# Patient Record
Sex: Male | Born: 1975 | Race: White | Hispanic: No | Marital: Married | Smoking: Never smoker
Health system: Southern US, Community
[De-identification: ages and names within clinical notes are randomized; demographics above are authoritative.]

## PROBLEM LIST (undated history)

## (undated) DIAGNOSIS — G43909 Migraine, unspecified, not intractable, without status migrainosus: Secondary | ICD-10-CM

## (undated) HISTORY — PX: WISDOM TOOTH EXTRACTION: SHX21

## (undated) HISTORY — PX: HAND SURGERY: SHX662

---

## 2011-12-26 ENCOUNTER — Ambulatory Visit: Payer: Self-pay | Admitting: Family Medicine

## 2014-02-20 ENCOUNTER — Ambulatory Visit: Payer: Self-pay | Admitting: Physician Assistant

## 2017-10-20 ENCOUNTER — Ambulatory Visit: Payer: BLUE CROSS/BLUE SHIELD

## 2017-10-20 ENCOUNTER — Other Ambulatory Visit: Payer: Self-pay

## 2017-10-20 ENCOUNTER — Ambulatory Visit (INDEPENDENT_AMBULATORY_CARE_PROVIDER_SITE_OTHER): Payer: BLUE CROSS/BLUE SHIELD

## 2017-10-20 ENCOUNTER — Encounter: Payer: Self-pay | Admitting: Emergency Medicine

## 2017-10-20 ENCOUNTER — Ambulatory Visit
Admission: EM | Admit: 2017-10-20 | Discharge: 2017-10-20 | Disposition: A | Discharge status: Home / Self Care | Payer: BLUE CROSS/BLUE SHIELD | Attending: Family Medicine | Admitting: Family Medicine

## 2017-10-20 DIAGNOSIS — S2231XA Fracture of one rib, right side, initial encounter for closed fracture: Secondary | ICD-10-CM

## 2017-10-20 DIAGNOSIS — W19XXXA Unspecified fall, initial encounter: Secondary | ICD-10-CM

## 2017-10-20 DIAGNOSIS — R0789 Other chest pain: Secondary | ICD-10-CM | POA: Diagnosis not present

## 2017-10-20 HISTORY — DX: Migraine, unspecified, not intractable, without status migrainosus: G43.909

## 2017-10-20 MED ORDER — HYDROCODONE-ACETAMINOPHEN 5-325 MG PO TABS: ORAL_TABLET | ORAL | 0 refills | Status: DC

## 2017-10-20 MED ORDER — CLONIDINE HCL 0.1 MG PO TABS: 0.1000 mg | ORAL_TABLET | Freq: Every day | ORAL | Status: DC

## 2017-10-20 NOTE — ED Triage Notes (Signed)
Patient in today after falling on 10/11/17. Patient states he was sleep walking and fell down the steps outside and landed on concrete. Patient having difficulty taking a deep breath.

## 2017-10-20 NOTE — Discharge Instructions (Signed)
Rest, ice , ibuprofen.

## 2017-10-20 NOTE — ED Provider Notes (Signed)
[facility]    CSN: [id number] Arrival date & time: 10/20/17  0803     History   Chief Complaint Chief Complaint  Patient presents with  . Fall    DOA 10/11/17    HPI Jonathan Cooper is a 42 y.o. male.   42 yo male with a c/o right upper chest pain since falling about 1 week ago while sleepwalking. States pain is worse with deep breaths or position changes.   The history is provided by the patient.  Fall     Past Medical History:  Diagnosis Date  . Migraine     There are no active problems to display for this patient.   Past Surgical History:  Procedure Laterality Date  . HAND SURGERY Right   . WISDOM TOOTH EXTRACTION         Home Medications    Prior to Admission medications   Medication Sig Start Date End Date Taking? Authorizing Provider  HYDROcodone-acetaminophen (NORCO/VICODIN) 5-325 MG tablet 1-2 tabs po qd prn 10/20/17   Payton Mccallumonty, Rosilyn Coachman, MD    Family History Family History  Problem Relation Age of Onset  . Diabetes Mother   . Hypertension Father     Social History Social History   Tobacco Use  . Smoking status: Never Smoker  . Smokeless tobacco: Former Engineer, waterUser  Substance Use Topics  . Alcohol use: Yes    Alcohol/week: 1.8 oz    Types: 3 Cans of beer per week  . Drug use: Never     Allergies   Patient has no known allergies.   Review of Systems Review of Systems   Physical Exam Triage Vital Signs ED Triage Vitals [10/20/17 0817]  Enc Vitals Group     BP (!) 144/97     Pulse Rate 79     Resp 16     Temp 97.9 F (36.6 C)     Temp Source Oral     SpO2 100 %     Weight 230 lb (104.3 kg)     Height 6' (1.829 m)     Head Circumference      Peak Flow      Pain Score 2     Pain Loc      Pain Edu?      Excl. in [place]?    No data found.  Updated Vital Signs BP (!) 144/97 (BP Location: Left Arm)   Pulse 79   Temp 97.9 F (36.6 C) (Oral)   Resp 16   Ht 6' (1.829 m)   Wt 230 lb (104.3 kg)   SpO2 100%    BMI 31.19 kg/m   Visual Acuity Right Eye Distance:   Left Eye Distance:   Bilateral Distance:    Right Eye Near:   Left Eye Near:    Bilateral Near:     Physical Exam  Constitutional: He appears well-developed and well-nourished. No distress.  Cardiovascular: Normal rate, regular rhythm, normal heart sounds and intact distal pulses.  Pulmonary/Chest: Effort normal and breath sounds normal. No stridor. No respiratory distress. He has no wheezes. He has no rales. He exhibits tenderness (right mid upper).  Skin: He is not diaphoretic.  Nursing note and vitals reviewed.    UC Treatments / Results  Labs (all labs ordered are listed, but only abnormal results are displayed) Labs Reviewed - No data to display  EKG None  Radiology Dg Ribs Unilateral W/chest Right  Result Date: 10/20/2017 CLINICAL DATA:  fall; pain EXAM: RIGHT  RIBS AND CHEST - 3+ VIEW COMPARISON:  None. FINDINGS: The heart size appears normal. There is no pleural effusion or pneumothorax. No airspace opacities. There is an acute right anterior seventh rib fracture. IMPRESSION: 1. Right anterior seventh rib fracture deformity. Electronically Signed   By: Signa Kell M.D.   On: 10/20/2017 09:53    Procedures Procedures (including critical care time)  Medications Ordered in UC Medications  cloNIDine (CATAPRES) tablet 0.1 mg (has no administration in time range)    Initial Impression / Assessment and Plan / UC Course  I have reviewed the triage vital signs and the nursing notes.  Pertinent labs & imaging results that were available during my care of the patient were reviewed by me and considered in my medical decision making (see chart for details).     Final Clinical Impressions(s) / UC Diagnoses   Final diagnoses:  Closed fracture of one rib of right side, initial encounter     Discharge Instructions     Rest, ice, ibuprofen     ED Prescriptions    Medication Sig Dispense Auth. Provider    HYDROcodone-acetaminophen (NORCO/VICODIN) 5-325 MG tablet 1-2 tabs po qd prn 10 tablet Toron Bowring, Pamala Hurry, MD     1. x-ray results and diagnosis reviewed with patient 2. rx as per orders above; reviewed possible side effects, interactions, risks and benefits  3. Recommend supportive treatment as above 4. Follow-up prn if symptoms worsen or don't improve   Controlled Substance Prescriptions [place] Controlled Substance Registry consulted? Not Applicable   Payton Mccallum, MD 10/20/17 1024

## 2019-01-05 ENCOUNTER — Ambulatory Visit (INDEPENDENT_AMBULATORY_CARE_PROVIDER_SITE_OTHER): Payer: BC Managed Care – PPO

## 2019-01-05 ENCOUNTER — Ambulatory Visit
Admission: EM | Admit: 2019-01-05 | Discharge: 2019-01-05 | Disposition: A | Discharge status: Home / Self Care | Payer: BC Managed Care – PPO | Attending: Family Medicine | Admitting: Family Medicine

## 2019-01-05 ENCOUNTER — Other Ambulatory Visit: Payer: Self-pay

## 2019-01-05 DIAGNOSIS — M79642 Pain in left hand: Secondary | ICD-10-CM

## 2019-01-05 DIAGNOSIS — S6992XA Unspecified injury of left wrist, hand and finger(s), initial encounter: Secondary | ICD-10-CM

## 2019-01-05 DIAGNOSIS — S6010XA Contusion of unspecified finger with damage to nail, initial encounter: Secondary | ICD-10-CM | POA: Diagnosis not present

## 2019-01-05 DIAGNOSIS — W231XXA Caught, crushed, jammed, or pinched between stationary objects, initial encounter: Secondary | ICD-10-CM | POA: Diagnosis not present

## 2019-01-05 MED ORDER — MELOXICAM 15 MG PO TABS: 15.0000 mg | ORAL_TABLET | Freq: Every day | ORAL | 0 refills | Status: AC | PRN

## 2019-01-05 NOTE — Discharge Instructions (Signed)
Medication as needed.  No fracture.  Take care  Dr. Lacinda Axon

## 2019-01-05 NOTE — ED Triage Notes (Signed)
Patient states that he smashed his left hand while carrying a TV last night. Patient states that pain radiates from fingers up his left arm. Patient has blood under fingernail of left ring finger.

## 2019-01-05 NOTE — ED Provider Notes (Signed)
[facility]    CSN: [id number] Arrival date & time: 01/05/19  0820  History   Chief Complaint Chief Complaint  Patient presents with   Hand Pain    left hand   HPI  43 year old male presents with left hand pain.  Patient was moving a TV last night.  He states that he was holding the TV and the base of the TV when the base came off and he subsequently smashed his left hand.  Patient is reporting diffuse hand pain but is most notable at the left fourth digit.  He has a subungual hematoma of this digit as well.  Mild swelling of this digit.  Pain is currently 7/10 in severity.  Described as throbbing.  No relieving factors.  No other associated symptoms.  No other complaints.  PMH, Surgical Hx, Family Hx, Social History reviewed and updated as below.  Past Medical History:  Diagnosis Date   Migraine   Retinal vein occlusion  Past Surgical History:  Procedure Laterality Date   HAND SURGERY Right    WISDOM TOOTH EXTRACTION      Home Medications    Prior to Admission medications   Medication Sig Start Date End Date Taking? Authorizing Provider  meloxicam (MOBIC) 15 MG tablet Take 1 tablet (15 mg total) by mouth daily as needed. 01/05/19   Coral Spikes, DO    Family History Family History  Problem Relation Age of Onset   Diabetes Mother    Hypertension Father     Social History Social History   Tobacco Use   Smoking status: Never Smoker   Smokeless tobacco: Former Systems developer  Substance Use Topics   Alcohol use: Yes    Alcohol/week: 3.0 standard drinks    Types: 3 Cans of beer per week    Comment: occasionally   Drug use: Never     Allergies   Patient has no known allergies.   Review of Systems Review of Systems  Constitutional: Negative.   Musculoskeletal:       Left hand pain.  Skin:       Subungual hematoma.   Physical Exam Triage Vital Signs ED Triage Vitals  Enc Vitals Group     BP 01/05/19 0835 (!) 135/97     Pulse Rate  01/05/19 0835 62     Resp 01/05/19 0835 16     Temp 01/05/19 0835 98.6 F (37 C)     Temp Source 01/05/19 0835 Oral     SpO2 01/05/19 0835 100 %     Weight 01/05/19 0832 225 lb (102.1 kg)     Height 01/05/19 0832 6' (1.829 m)     Head Circumference --      Peak Flow --      Pain Score 01/05/19 0832 7     Pain Loc --      Pain Edu? --      Excl. in [place]? --    Updated Vital Signs BP (!) 135/97 (BP Location: Right Arm)    Pulse 62    Temp 98.6 F (37 C) (Oral)    Resp 16    Ht 6' (1.829 m)    Wt 102.1 kg    SpO2 100%    BMI 30.52 kg/m   Visual Acuity Right Eye Distance:   Left Eye Distance:   Bilateral Distance:    Right Eye Near:   Left Eye Near:    Bilateral Near:     Physical Exam Vitals signs and nursing  note reviewed.  Constitutional:      General: He is not in acute distress.    Appearance: Normal appearance. He is not ill-appearing.  HENT:     Head: Normocephalic and atraumatic.  Eyes:     General:        Right eye: No discharge.        Left eye: No discharge.     Conjunctiva/sclera: Conjunctivae normal.  Pulmonary:     Effort: Pulmonary effort is normal. No respiratory distress.  Musculoskeletal:     Comments: Left wrist -no discrete areas of tenderness.    Left hand -patient with tenderness over the DIP joint and distally (left 4th digit).  No other discrete areas of tenderness.  Skin:    Comments: Left 4th digit -moderate subungual hematoma.  Neurological:     Mental Status: He is alert.  Psychiatric:        Mood and Affect: Mood normal.        Behavior: Behavior normal.    UC Treatments / Results  Labs (all labs ordered are listed, but only abnormal results are displayed) Labs Reviewed - No data to display  EKG   Radiology Dg Hand Complete Left  Result Date: 01/05/2019 CLINICAL DATA:  Pt injured left hand carrying TV and 4th finger got smashed when he struggled not to drop it carrying down stairs. Most pain pain with blood under nail bed of  4th finger distal phalanx that has been drained. Small laceration left.*comment was truncated*pain, smashed hand by a TV EXAM: LEFT HAND - COMPLETE 3+ VIEW COMPARISON:  None. FINDINGS: No evidence of fracture of the carpal or metacarpal bones. Radiocarpal joint is intact. Phalanges are normal. No soft tissue injury. IMPRESSION: No fracture or dislocation. Electronically Signed   By: Genevive BiStewart  Edmunds M.D.   On: 01/05/2019 09:16    Procedures Procedures (including critical care time) Drainage of subungual hematoma Area was cleaned with alcohol.  Cautery was used to puncture the nail and drain hematoma.  Patient tolerated procedure without difficulty.  Medications Ordered in UC Medications - No data to display  Initial Impression / Assessment and Plan / UC Course  I have reviewed the triage vital signs and the nursing notes.  Pertinent labs & imaging results that were available during my care of the patient were reviewed by me and considered in my medical decision making (see chart for details).    43 year old male presents with a hand injury.  X-ray negative.  Subungual hematoma was drained today.  See above.  Meloxicam as needed for pain.  Requested a finger splint and was splinted by nursing staff.  Supportive care.  Final Clinical Impressions(s) / UC Diagnoses   Final diagnoses:  Subungual hematoma of digit of hand, initial encounter  Injury of left hand, initial encounter     Discharge Instructions     Medication as needed.  No fracture.  Take care  Dr. Adriana Simasook     ED Prescriptions    Medication Sig Dispense Auth. Provider   meloxicam (MOBIC) 15 MG tablet Take 1 tablet (15 mg total) by mouth daily as needed. 30 tablet Tommie Samsook, Latondra Gebhart G, DO     Controlled Substance Prescriptions [place] Controlled Substance Registry consulted? Not Applicable   Tommie SamsCook, Tyresha Fede G, [place] 01/05/19 [id number]

## 2019-02-24 IMAGING — CR DG RIBS W/ CHEST 3+V*R*
5 series · 5 of 5 positions shown · non-contrast
Comparison: None.

CLINICAL DATA: fall; pain

EXAM:
RIGHT RIBS AND CHEST - 3+ VIEW

[chest pa]
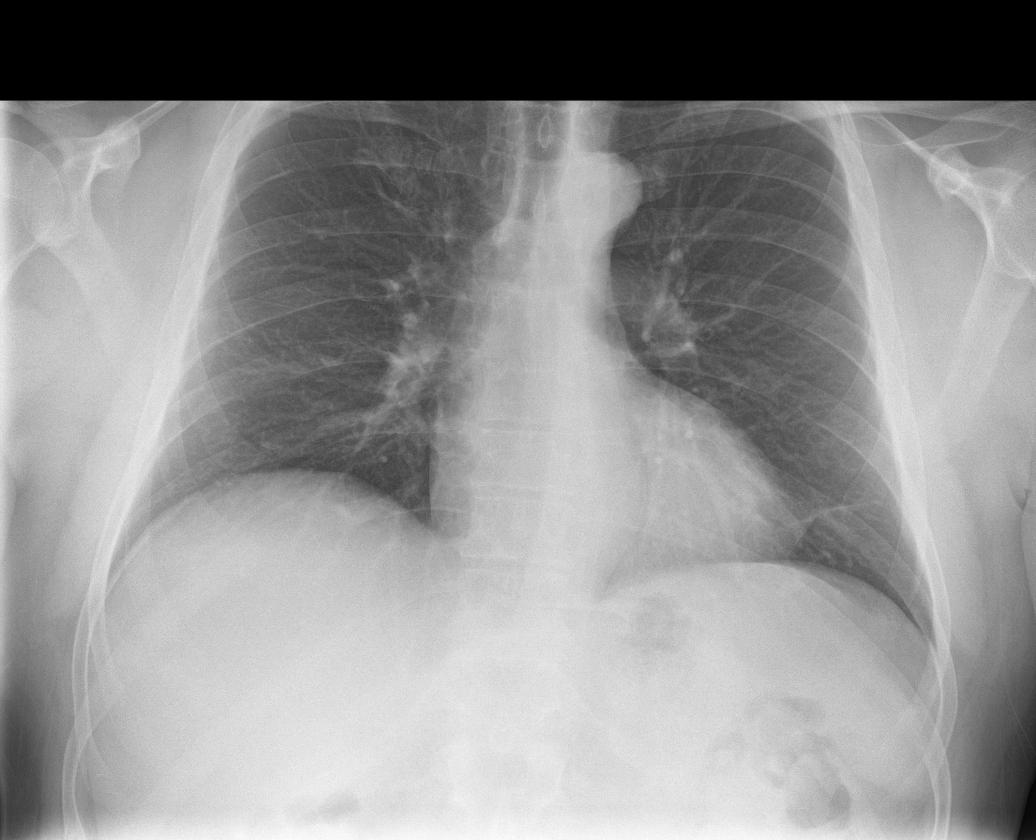

[rib pa (1 of 2)]
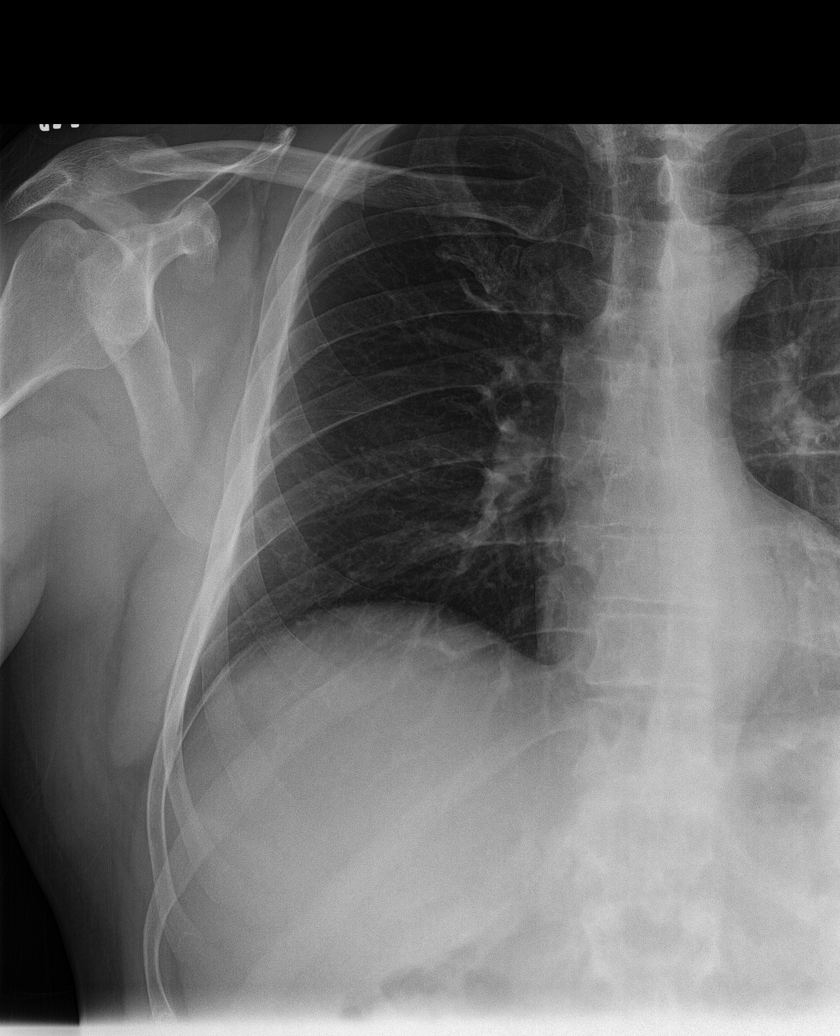

[rib pa (2 of 2)]
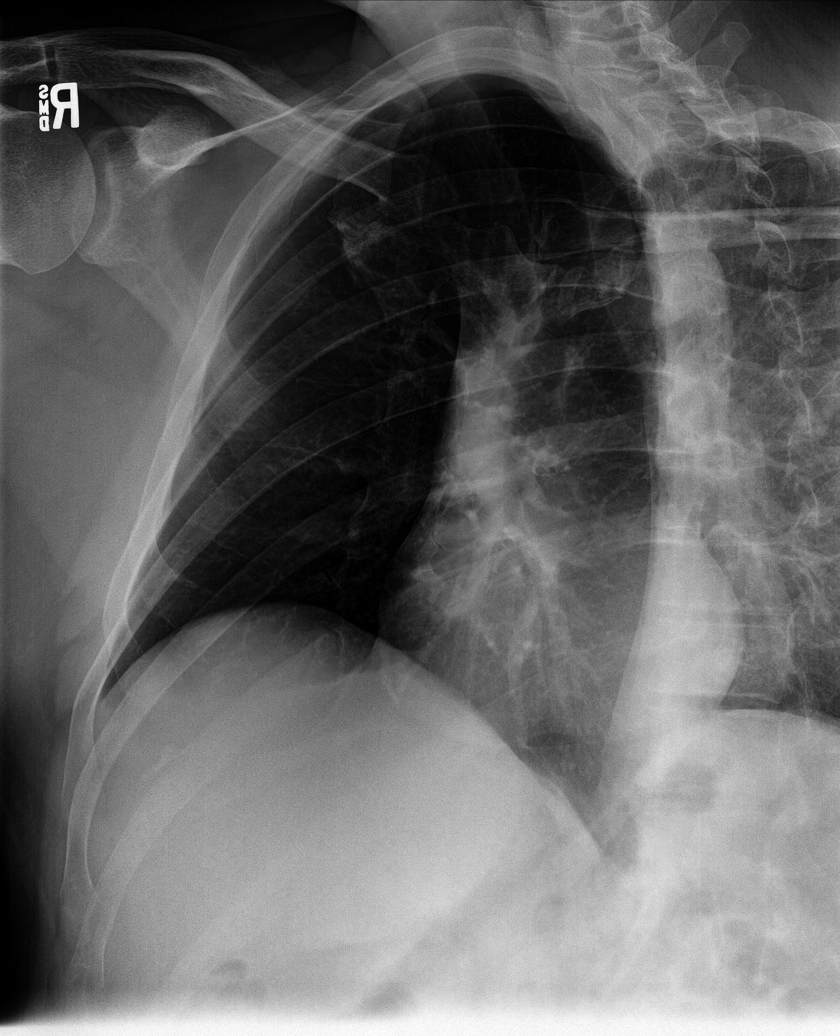

[rib obl (1 of 2)]
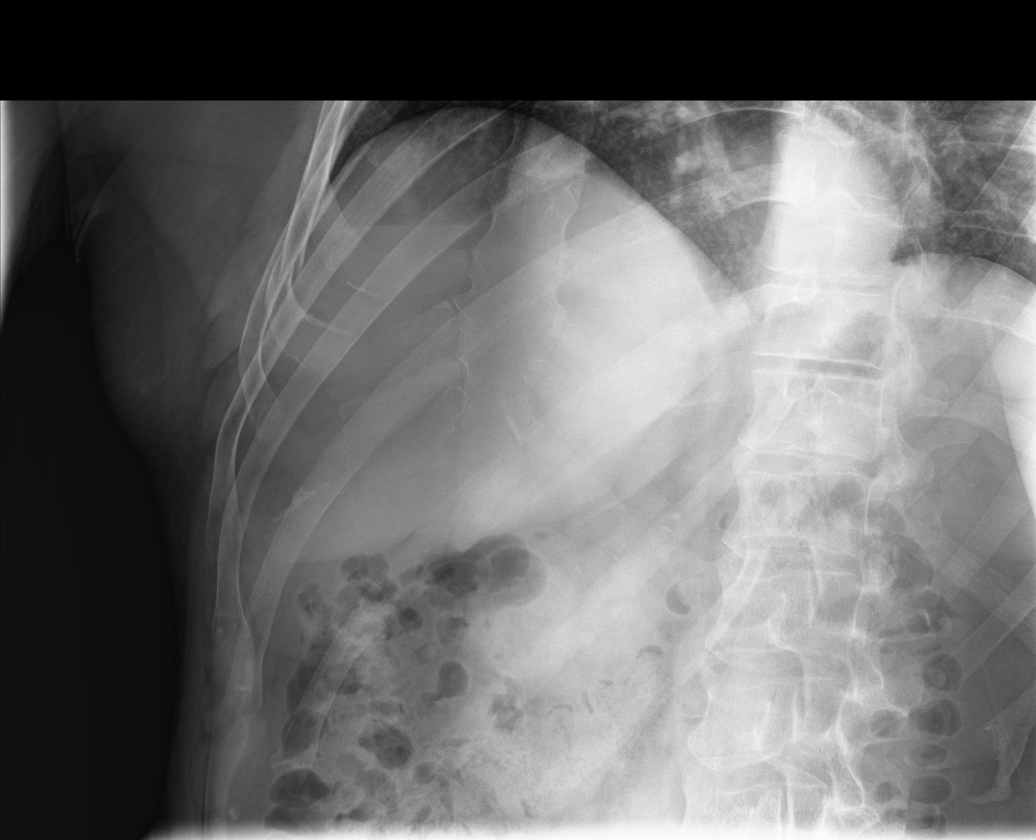

[rib obl (2 of 2)]
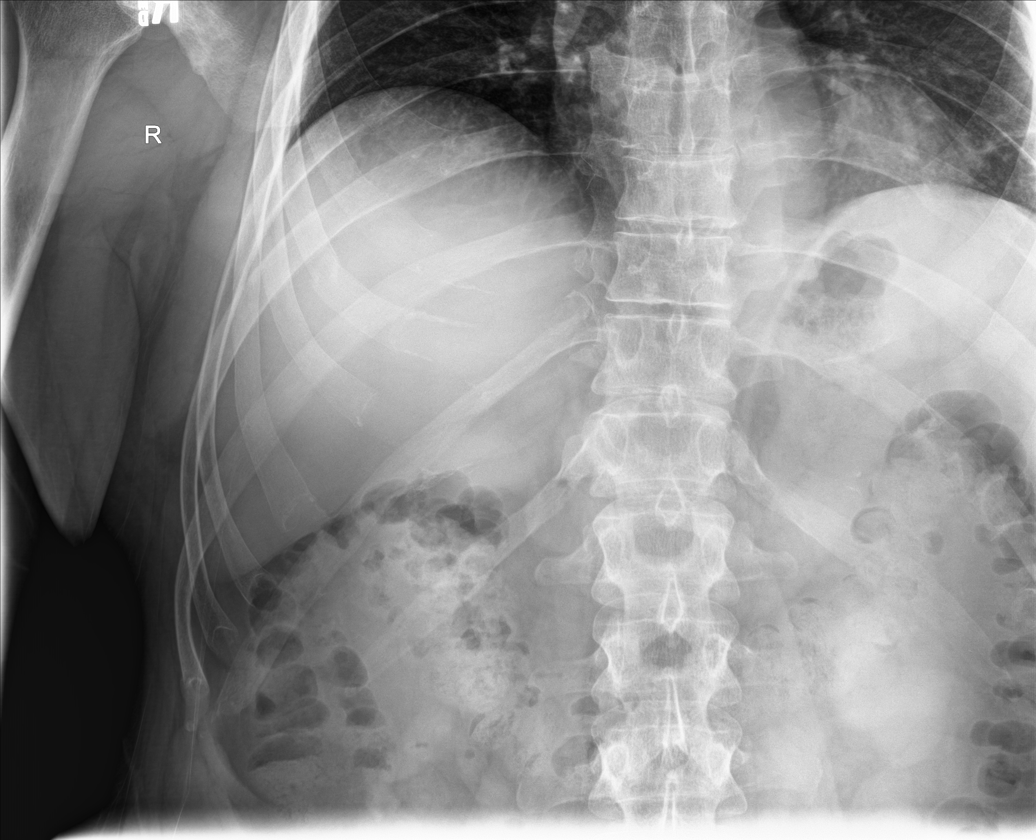

[5 of 5 positions shown; findings below may reference images not displayed]

FINDINGS: The heart size appears normal. There is no pleural effusion or
pneumothorax. No airspace opacities. There is an acute right
anterior seventh rib fracture.
IMPRESSION: 1. Right anterior seventh rib fracture deformity.
